# Patient Record
Sex: Male | Born: 1950 | Race: White | Hispanic: No | Marital: Married | Smoking: Never smoker
Health system: Southern US, Community
[De-identification: ages and names within clinical notes are randomized; demographics above are authoritative.]

## PROBLEM LIST (undated history)

## (undated) DIAGNOSIS — I10 Essential (primary) hypertension: Secondary | ICD-10-CM

## (undated) DIAGNOSIS — K219 Gastro-esophageal reflux disease without esophagitis: Secondary | ICD-10-CM

## (undated) DIAGNOSIS — N400 Enlarged prostate without lower urinary tract symptoms: Secondary | ICD-10-CM

## (undated) DIAGNOSIS — T8859XA Other complications of anesthesia, initial encounter: Secondary | ICD-10-CM

## (undated) HISTORY — PX: HERNIA REPAIR: SHX51

## (undated) HISTORY — PX: CLAVICLE SURGERY: SHX598

## (undated) HISTORY — PX: VASECTOMY: SHX75

---

## 2005-06-09 ENCOUNTER — Ambulatory Visit: Payer: Self-pay | Admitting: Internal Medicine

## 2005-06-24 ENCOUNTER — Ambulatory Visit: Payer: Self-pay | Admitting: Internal Medicine

## 2006-04-01 ENCOUNTER — Ambulatory Visit: Payer: Self-pay | Admitting: Internal Medicine

## 2006-04-06 ENCOUNTER — Ambulatory Visit: Payer: Self-pay | Admitting: Internal Medicine

## 2006-05-03 ENCOUNTER — Emergency Department (HOSPITAL_COMMUNITY): Admission: EM | Admit: 2006-05-03 | Discharge: 2006-05-03 | Payer: Self-pay | Admitting: Emergency Medicine

## 2006-05-19 DIAGNOSIS — G2581 Restless legs syndrome: Secondary | ICD-10-CM | POA: Insufficient documentation

## 2006-05-20 ENCOUNTER — Ambulatory Visit: Payer: Self-pay | Admitting: Internal Medicine

## 2006-07-12 ENCOUNTER — Ambulatory Visit: Payer: Self-pay | Admitting: Internal Medicine

## 2006-07-12 DIAGNOSIS — F988 Other specified behavioral and emotional disorders with onset usually occurring in childhood and adolescence: Secondary | ICD-10-CM | POA: Insufficient documentation

## 2006-07-12 DIAGNOSIS — I889 Nonspecific lymphadenitis, unspecified: Secondary | ICD-10-CM

## 2006-07-18 ENCOUNTER — Ambulatory Visit: Payer: Self-pay | Admitting: Gastroenterology

## 2006-07-29 ENCOUNTER — Ambulatory Visit: Payer: Self-pay | Admitting: Gastroenterology

## 2006-07-29 ENCOUNTER — Encounter: Payer: Self-pay | Admitting: Internal Medicine

## 2006-07-29 ENCOUNTER — Encounter: Payer: Self-pay | Admitting: Gastroenterology

## 2006-08-16 ENCOUNTER — Telehealth (INDEPENDENT_AMBULATORY_CARE_PROVIDER_SITE_OTHER): Payer: Self-pay | Admitting: *Deleted

## 2006-09-21 ENCOUNTER — Telehealth (INDEPENDENT_AMBULATORY_CARE_PROVIDER_SITE_OTHER): Payer: Self-pay | Admitting: *Deleted

## 2006-10-28 ENCOUNTER — Ambulatory Visit: Payer: Self-pay | Admitting: Internal Medicine

## 2006-10-28 DIAGNOSIS — J309 Allergic rhinitis, unspecified: Secondary | ICD-10-CM | POA: Insufficient documentation

## 2006-11-30 ENCOUNTER — Telehealth (INDEPENDENT_AMBULATORY_CARE_PROVIDER_SITE_OTHER): Payer: Self-pay | Admitting: *Deleted

## 2007-04-25 ENCOUNTER — Telehealth: Payer: Self-pay | Admitting: Internal Medicine
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
## 2011-07-29 ENCOUNTER — Encounter: Payer: Self-pay | Admitting: Gastroenterology

## 2012-04-27 ENCOUNTER — Encounter: Payer: Self-pay | Admitting: Gastroenterology

## 2013-12-27 ENCOUNTER — Encounter (HOSPITAL_BASED_OUTPATIENT_CLINIC_OR_DEPARTMENT_OTHER): Payer: Self-pay | Admitting: *Deleted

## 2013-12-27 ENCOUNTER — Emergency Department (HOSPITAL_BASED_OUTPATIENT_CLINIC_OR_DEPARTMENT_OTHER): Payer: BC Managed Care – PPO

## 2013-12-27 ENCOUNTER — Emergency Department (HOSPITAL_BASED_OUTPATIENT_CLINIC_OR_DEPARTMENT_OTHER)
Admission: EM | Admit: 2013-12-27 | Discharge: 2013-12-27 | Disposition: A | Discharge status: Home / Self Care | Payer: BC Managed Care – PPO | Attending: Emergency Medicine | Admitting: Emergency Medicine

## 2013-12-27 DIAGNOSIS — J209 Acute bronchitis, unspecified: Secondary | ICD-10-CM | POA: Diagnosis not present

## 2013-12-27 DIAGNOSIS — R05 Cough: Secondary | ICD-10-CM | POA: Diagnosis present

## 2013-12-27 DIAGNOSIS — J0110 Acute frontal sinusitis, unspecified: Secondary | ICD-10-CM | POA: Diagnosis not present

## 2013-12-27 DIAGNOSIS — Z87438 Personal history of other diseases of male genital organs: Secondary | ICD-10-CM | POA: Insufficient documentation

## 2013-12-27 DIAGNOSIS — R059 Cough, unspecified: Secondary | ICD-10-CM

## 2013-12-27 DIAGNOSIS — J40 Bronchitis, not specified as acute or chronic: Secondary | ICD-10-CM

## 2013-12-27 HISTORY — DX: Benign prostatic hyperplasia without lower urinary tract symptoms: N40.0

## 2013-12-27 MED ORDER — AMOXICILLIN-POT CLAVULANATE 875-125 MG PO TABS: 1.0000 | ORAL_TABLET | Freq: Two times a day (BID) | ORAL | Status: DC

## 2013-12-27 MED ORDER — ALBUTEROL SULFATE HFA 108 (90 BASE) MCG/ACT IN AERS: 1.0000 | INHALATION_SPRAY | Freq: Four times a day (QID) | RESPIRATORY_TRACT | Status: DC | PRN

## 2013-12-27 NOTE — ED Provider Notes (Signed)
CSN: [id number]     Arrival date & time 12/27/13  [id number] History   First MD Initiated Contact with Patient 12/27/13 [phone]     Chief Complaint  Patient presents with  . Cough     (Consider location/radiation/quality/duration/timing/severity/associated sxs/prior Treatment) The history is provided by the patient.  Darren Thornton is a 63 y.o. male history of BPH, previous sinus infections here with sinus congestion and cough. Some sinus congestion and runny nose with greenish drainage for the last week. Also having productive cough with greenish sputum. States that he has some sinus pressure and some headaches associated with it. Denies any fevers. Has been trying Tylenol and Motrin and guaifenesin with minimal relief. Denies any chest pain. Has previous sinus infection several years ago.     Past Medical History  Diagnosis Date  . Prostate enlargement    History reviewed. No pertinent past surgical history. History reviewed. No pertinent family history. History  Substance Use Topics  . Smoking status: Never Smoker   . Smokeless tobacco: Not on file  . Alcohol Use: Not on file    Review of Systems  HENT: Positive for congestion and rhinorrhea.   Respiratory: Positive for cough.   All other systems reviewed and are negative.     Allergies  Review of patient's allergies indicates no known allergies.  Home Medications   Prior to Admission medications   Not on File   BP 171/95 mmHg  Pulse 60  Temp(Src) 98 F (36.7 C) (Oral)  Resp 16  Ht 5\' 7"  (1.702 m)  Wt 155 lb (70.308 kg)  BMI 24.27 kg/m2  SpO2 100% Physical Exam  Constitutional: He is oriented to person, place, and time.  Slightly uncomfortable   HENT:  Head: Normocephalic.  Mouth/Throat: Oropharynx is clear and moist.  + sinus pressure. No obvious purulent drainage. OP clear   Eyes: Conjunctivae and EOM are normal. Pupils are equal, round, and reactive to light.  Neck: Normal range of motion. Neck supple.   Cardiovascular: Normal rate, regular rhythm and normal heart sounds.   Pulmonary/Chest:  Diminished breath sounds throughout. No obvious crackles or wheezing   Abdominal: Soft. Bowel sounds are normal. He exhibits no distension. There is no tenderness. There is no rebound and no guarding.  Musculoskeletal: Normal range of motion. He exhibits no edema or tenderness.  Neurological: He is alert and oriented to person, place, and time. No cranial nerve deficit. Coordination normal.  Skin: Skin is warm and dry.  Psychiatric: He has a normal mood and affect. His behavior is normal. Judgment and thought content normal.  Nursing note and vitals reviewed.   ED Course  Procedures (including critical care time) Labs Review Labs Reviewed - No data to display  Imaging Review Dg Chest 2 View  12/27/2013   CLINICAL DATA:  Cough and chest congestion.  EXAM: CHEST  2 VIEW  COMPARISON:  05/03/2006  FINDINGS: There is peribronchial thickening. There are no infiltrates or effusions. Tiny areas of linear atelectasis at the lung bases. Heart size and vascularity are normal. No significant osseous abnormality. Old left clavicle fracture. Chronic AC joint separation on the right.  IMPRESSION: Bronchitic changes.   Electronically Signed   By: Geanie CooleyJim  Maxwell M.D.   On: 12/27/2013 07:59     EKG Interpretation None      MDM   Final diagnoses:  Cough    Darren Thornton is a 63 y.o. male here with cough, sinus congestion. Likely sinusitis vs bronchitis vs viral syndrome. Will  get CXR. Will likely treat with abx.   8:06 AM CXR showed bronchitis. Will d/c home with augmentin, albuterol prn.     Richardean Canalavid H Yao, MD 12/27/13 [phone]

## 2013-12-27 NOTE — Discharge Instructions (Signed)
Take augmentin twice a day for a week.   Stay hydrated.   Continue tylenol, motrin.   Use albuterol as needed for cough.   Follow up with your doctor   Return to ER if you have worse cough, fever, trouble breathing.

## 2013-12-27 NOTE — ED Notes (Signed)
Pt amb to room 5 with quick steady gait in nad. Pt reports cough producing green and yellow phlegm x 1 week, with sinus drainage. Denies any fevers.

## 2017-08-06 ENCOUNTER — Emergency Department (HOSPITAL_BASED_OUTPATIENT_CLINIC_OR_DEPARTMENT_OTHER)
Admission: EM | Admit: 2017-08-06 | Discharge: 2017-08-06 | Disposition: A | Discharge status: Home / Self Care | Payer: Medicare Other | Attending: Emergency Medicine | Admitting: Emergency Medicine

## 2017-08-06 ENCOUNTER — Encounter (HOSPITAL_BASED_OUTPATIENT_CLINIC_OR_DEPARTMENT_OTHER): Payer: Self-pay | Admitting: *Deleted

## 2017-08-06 ENCOUNTER — Other Ambulatory Visit: Payer: Self-pay

## 2017-08-06 DIAGNOSIS — Y999 Unspecified external cause status: Secondary | ICD-10-CM | POA: Insufficient documentation

## 2017-08-06 DIAGNOSIS — S80811A Abrasion, right lower leg, initial encounter: Secondary | ICD-10-CM | POA: Diagnosis not present

## 2017-08-06 DIAGNOSIS — T07XXXA Unspecified multiple injuries, initial encounter: Secondary | ICD-10-CM

## 2017-08-06 DIAGNOSIS — I10 Essential (primary) hypertension: Secondary | ICD-10-CM | POA: Diagnosis not present

## 2017-08-06 DIAGNOSIS — Z23 Encounter for immunization: Secondary | ICD-10-CM | POA: Insufficient documentation

## 2017-08-06 DIAGNOSIS — Y929 Unspecified place or not applicable: Secondary | ICD-10-CM | POA: Insufficient documentation

## 2017-08-06 DIAGNOSIS — S51011A Laceration without foreign body of right elbow, initial encounter: Secondary | ICD-10-CM | POA: Insufficient documentation

## 2017-08-06 DIAGNOSIS — Y939 Activity, unspecified: Secondary | ICD-10-CM | POA: Diagnosis not present

## 2017-08-06 DIAGNOSIS — S59901A Unspecified injury of right elbow, initial encounter: Secondary | ICD-10-CM | POA: Diagnosis present

## 2017-08-06 HISTORY — DX: Essential (primary) hypertension: I10

## 2017-08-06 HISTORY — DX: Gastro-esophageal reflux disease without esophagitis: K21.9

## 2017-08-06 MED ORDER — BACITRACIN ZINC 500 UNIT/GM EX OINT
TOPICAL_OINTMENT | Freq: Two times a day (BID) | CUTANEOUS | Status: DC
  Administered 2017-08-06: 16:00:00 via TOPICAL

## 2017-08-06 MED ORDER — TETANUS-DIPHTH-ACELL PERTUSSIS 5-2.5-18.5 LF-MCG/0.5 IM SUSP
0.5000 mL | Freq: Once | INTRAMUSCULAR | Status: AC
  Administered 2017-08-06: 0.5 mL via INTRAMUSCULAR
  Filled 2017-08-06: qty 0.5

## 2017-08-06 MED ORDER — LIDOCAINE-EPINEPHRINE (PF) 2 %-1:200000 IJ SOLN
10.0000 mL | Freq: Once | INTRAMUSCULAR | Status: AC
  Administered 2017-08-06: 10 mL
  Filled 2017-08-06 (×2): qty 10

## 2017-08-06 MED ORDER — CEPHALEXIN 500 MG PO CAPS: 500.0000 mg | ORAL_CAPSULE | Freq: Four times a day (QID) | ORAL | 0 refills | Status: DC

## 2017-08-06 NOTE — Discharge Instructions (Signed)
Please read and follow all provided instructions.  Your diagnoses today include:  1. Laceration of right elbow, initial encounter   2. Abrasions of multiple sites     Tests performed today include:  Vital signs. See below for your results today.   Medications prescribed:   Keflex (cephalexin) - antibiotic  You have been prescribed an antibiotic medicine: take the entire course of medicine even if you are feeling better. Stopping early can cause the antibiotic not to work.  Take any prescribed medications only as directed.   Home care instructions:  Follow any educational materials and wound care instructions contained in this packet.   Keep affected area above the level of your heart when possible to minimize swelling. Wash area gently twice a day with warm soapy water. Do not apply alcohol or hydrogen peroxide. Cover the area if it draining or weeping.   Follow-up instructions: Suture Removal: Return to the Emergency Department or see your primary care care doctor in 10 days for a recheck of your wound and removal of your sutures or staples.    Return instructions:  Return to the Emergency Department if you have:  Fever  Worsening pain  Worsening swelling of the wound  Pus draining from the wound  Redness of the skin that moves away from the wound, especially if it streaks away from the affected area   Any other emergent concerns  Your vital signs today were: BP (!) 147/95 (BP Location: Left Arm)    Pulse 71    Temp 98 F (36.7 C) (Oral)    Resp 19    Ht 5\' 7"  (1.702 m)    Wt 72.6 kg (160 lb)    SpO2 99%    BMI 25.06 kg/m  If your blood pressure (BP) was elevated above 135/85 this visit, please have this repeated by your doctor within one month. --------------
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
## 2017-08-07 ENCOUNTER — Encounter (HOSPITAL_BASED_OUTPATIENT_CLINIC_OR_DEPARTMENT_OTHER): Payer: Self-pay

## 2019-02-13 ENCOUNTER — Ambulatory Visit: Payer: BLUE CROSS/BLUE SHIELD

## 2019-02-24 ENCOUNTER — Ambulatory Visit: Payer: Medicare Other | Attending: Internal Medicine

## 2019-03-06 ENCOUNTER — Ambulatory Visit: Payer: BLUE CROSS/BLUE SHIELD

## 2019-12-11 ENCOUNTER — Emergency Department (HOSPITAL_BASED_OUTPATIENT_CLINIC_OR_DEPARTMENT_OTHER)
Admission: EM | Admit: 2019-12-11 | Discharge: 2019-12-11 | Disposition: A | Discharge status: Home / Self Care | Payer: Medicare Other | Attending: Emergency Medicine | Admitting: Emergency Medicine

## 2019-12-11 ENCOUNTER — Encounter (HOSPITAL_BASED_OUTPATIENT_CLINIC_OR_DEPARTMENT_OTHER): Payer: Self-pay

## 2019-12-11 ENCOUNTER — Other Ambulatory Visit: Payer: Self-pay

## 2019-12-11 DIAGNOSIS — R339 Retention of urine, unspecified: Secondary | ICD-10-CM

## 2019-12-11 DIAGNOSIS — I10 Essential (primary) hypertension: Secondary | ICD-10-CM | POA: Insufficient documentation

## 2019-12-11 LAB — URINALYSIS, ROUTINE W REFLEX MICROSCOPIC
Bilirubin Urine: NEGATIVE
Glucose, UA: NEGATIVE mg/dL
Ketones, ur: NEGATIVE mg/dL
Leukocytes,Ua: NEGATIVE
Nitrite: NEGATIVE
Protein, ur: NEGATIVE mg/dL
Specific Gravity, Urine: 1.01 (ref 1.005–1.030)
pH: 5.5 (ref 5.0–8.0)

## 2019-12-11 LAB — URINALYSIS, MICROSCOPIC (REFLEX)

## 2019-12-11 NOTE — ED Notes (Signed)
ED Provider at bedside. 

## 2019-12-11 NOTE — ED Triage Notes (Signed)
Pt arrives with pain and trouble urinating denies history of same. Pt reports he thinks he has a kidney stone.

## 2019-12-11 NOTE — ED Provider Notes (Signed)
[facility] EMERGENCY DEPARTMENT Provider Note   CSN: [id number] Arrival date & time: 12/11/19  1034     History Chief Complaint  Patient presents with  . Urinary Retention    Mouhamadou Gittleman is a 69 y.o. male w/ past medical history of BPH that presents to the emergency department today for urinary retention.  Patient has had urinary retention for the past couple of hours, went to [facility] who sent him here for further evaluation.  Patient follows Dr. Leonette Most, urology, [facility].  He states that he has not seen him for over 2 to 3 years, also states that he stopped taking his BPH medications 3 years ago.  Patient states that he was able to wake up this morning and urinate some, states that he was unable to fully relieve his bladder.  Patient states that he drank about 3 cups of coffee, and was not able to urinate.  States that he is having super abdominal pain.  Denies any nausea, vomiting, fevers, chills, recent back trauma, saddle paresthesias, new back pain.  Patient states that he was able to have a normal bowel movement this morning.  States he was in normal health yesterday.  States this is never happened to him before.  Denies any dysuria when he did urinate this morning.  States that he does have hypertension, however was told that he did not need to take blood pressure medication for this.  Denies pain elsewhere.  HPI     Past Medical History:  Diagnosis Date  . Acid reflux   . Hypertension    borderline  . Prostate enlargement     Patient Active Problem List   Diagnosis Date Noted  . RHINITIS 10/28/2006  . 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 ADD 07/12/2006  . RESTLESS LEG SYNDROME 05/19/2006    Past Surgical History:  Procedure Laterality Date  . CLAVICLE SURGERY    . HERNIA REPAIR    . VASECTOMY         History reviewed. No pertinent family history.  Social History   Tobacco Use  . Smoking status: Never Smoker  . Smokeless tobacco:  Never Used  Substance Use Topics  . Alcohol use: Yes  . Drug use: Never    Home Medications Prior to Admission medications   Medication Sig Start Date End Date Taking? Authorizing Provider  cephALEXin (KEFLEX) 500 MG capsule Take 1 capsule (500 mg total) by mouth 4 (four) times daily. 08/06/17   Renne Crigler, PA-C    Allergies    Patient has no known allergies.  Review of Systems   Review of Systems  Constitutional: Negative for chills, diaphoresis, fatigue and fever.  HENT: Negative for congestion, sore throat and trouble swallowing.   Eyes: Negative for pain and visual disturbance.  Respiratory: Negative for cough, shortness of breath and wheezing.   Cardiovascular: Negative for chest pain, palpitations and leg swelling.  Gastrointestinal: Positive for abdominal pain. Negative for abdominal distention, diarrhea, nausea and vomiting.  Genitourinary: Positive for decreased urine volume. Negative for difficulty urinating, discharge, enuresis, flank pain, frequency, hematuria, penile pain, penile swelling, scrotal swelling, testicular pain and urgency.  Musculoskeletal: Negative for back pain, neck pain and neck stiffness.  Skin: Negative for pallor.  Neurological: Negative for dizziness, speech difficulty, weakness and headaches.  Psychiatric/Behavioral: Negative for confusion.    Physical Exam Updated Vital Signs BP (!) 214/113 (BP Location: Right Arm)   Pulse 64   Temp 97.6 F (36.4 C) (Oral)  Resp 20   Ht 5\' 7"  (1.702 m)   Wt 74.8 kg   SpO2 99%   BMI 25.84 kg/m   Physical Exam Constitutional:      General: He is not in acute distress.    Appearance: Normal appearance. He is not ill-appearing, toxic-appearing or diaphoretic.  HENT:     Mouth/Throat:     Mouth: Mucous membranes are moist.     Pharynx: Oropharynx is clear.  Eyes:     General: No scleral icterus.    Extraocular Movements: Extraocular movements intact.     Pupils: Pupils are equal, round, and  reactive to light.  Cardiovascular:     Rate and Rhythm: Normal rate and regular rhythm.     Pulses: Normal pulses.     Heart sounds: Normal heart sounds.  Pulmonary:     Effort: Pulmonary effort is normal. No respiratory distress.     Breath sounds: Normal breath sounds. No stridor. No wheezing, rhonchi or rales.  Chest:     Chest wall: No tenderness.  Abdominal:     General: Abdomen is flat. There is no distension.     Palpations: Abdomen is soft.     Tenderness: There is abdominal tenderness in the suprapubic area. There is no guarding or rebound.  Musculoskeletal:        General: No swelling or tenderness. Normal range of motion.     Cervical back: Normal range of motion and neck supple. No rigidity.     Right lower leg: No edema.     Left lower leg: No edema.  Skin:    General: Skin is warm and dry.     Capillary Refill: Capillary refill takes less than 2 seconds.     Coloration: Skin is not pale.  Neurological:     General: No focal deficit present.     Mental Status: He is alert and oriented to person, place, and time.  Psychiatric:        Mood and Affect: Mood normal.        Behavior: Behavior normal.     ED Results / Procedures / Treatments   Labs (all labs ordered are listed, but only abnormal results are displayed) Labs Reviewed  URINALYSIS, ROUTINE W REFLEX MICROSCOPIC    EKG None  Radiology No results found.  Procedures Procedures (including critical care time)  Medications Ordered in ED Medications - No data to display  ED Course  I have reviewed the triage vital signs and the nursing notes.  Pertinent labs & imaging results that were available during my care of the patient were reviewed by me and considered in my medical decision making (see chart for details).    MDM Rules/Calculators/A&P                         Clive Parcel is a 69 y.o. male w/ past medical history of BPH that presents to the emergency department today for urinary  retention.  Patient with 906 mL of urine in bladder, most likely from known BPH.  No red flag symptoms.  No blood work is necessary at this time, patient will follow up with urologist.  Indwelling catheter placed, patient will remove this in the next 2 days.  Urinalysis not suggestive of UTI, small Hgb.   1 L of urine drained, pt feels much better, states that he is pain free. BP has resolved  - now 156/99 which pt states is normal for him.  Patient will follow up with urologist in the next 2 days, discussed in depth with patient and wife who are agreeable.  Doubt need for further emergent work up at this time. I explained the diagnosis and have given explicit precautions to return to the ER including for any other new or worsening symptoms. The patient understands and accepts the medical plan as it's been dictated and I have answered their questions. Discharge instructions concerning home care and prescriptions have been given. The patient is STABLE and is discharged to home in good condition.  I discussed this case with my attending physician who cosigned this note including patient's presenting symptoms, physical exam, and planned diagnostics and interventions. Attending physician stated agreement with plan or made changes to plan which were implemented.   Attending physician assessed patient at bedside.  Final Clinical Impression(s) / ED Diagnoses Final diagnoses:  Urinary retention    Rx / DC Orders ED Discharge Orders    None       Farrel Gordon, PA-C 12/11/19 1135    Cathren Laine, MD 12/11/19 1140

## 2019-12-11 NOTE — ED Notes (Signed)
Pt transitioned to leg bag by [facility] ED Tech and given education for home care. Total urine output was 1100 mL.

## 2019-12-11 NOTE — Discharge Instructions (Addendum)
Follow up in two days to have your catheter removed. You can either follow up with Dr. Leonette Most or make an appointment with the urologist provided. You can also go to an urgent care or come back here to get this removed. Please read the following instructions.  Please go to the emergency department for any new or concerning symptoms.

## 2020-01-03 ENCOUNTER — Encounter: Payer: Self-pay | Admitting: Orthopedic Surgery

## 2020-01-03 ENCOUNTER — Ambulatory Visit: Payer: Medicare Other | Admitting: Orthopedic Surgery

## 2020-01-03 ENCOUNTER — Ambulatory Visit: Payer: Self-pay

## 2020-01-03 ENCOUNTER — Other Ambulatory Visit: Payer: Self-pay

## 2020-01-03 DIAGNOSIS — M79672 Pain in left foot: Secondary | ICD-10-CM

## 2020-01-03 DIAGNOSIS — M216X2 Other acquired deformities of left foot: Secondary | ICD-10-CM

## 2020-01-03 NOTE — Progress Notes (Signed)
Office Visit Note   Patient: Darren Thornton           Date of Birth: 31-Dec-1950           MRN: [id number] Visit Date: 01/03/2020              Requested by: Loyal Jacobson, MD [address] [place],  [place] [address] PCP: Loyal Jacobson, MD  Chief Complaint  Patient presents with  . 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  Patient states that he has had some chronic pain of the base of the fifth metatarsal on the left foot.  He states that he had increased pain over the past 3 to 4 weeks he states he has no symptoms with all on a bicycle trainer denies any specific injury.  Patient has no history of diabetes hypertension or smoking.  Assessment & Plan: Visit Diagnoses:  1. Pain in left foot   2. Cavovarus deformity of foot, acquired, left     Plan: Patient has a chronic overloading of the peroneus brevis due to his cavovarus deformity and plantarflexed first ray.  Recommended a stiff soled Trail running sneaker recommended sole orthotics and cut out beneath the first metatarsal head to allow for plantarflexion of the first ray and this was drawn on his orthotic.  This should get his foot out and stressing the lateral column and should resolve his symptoms.  Follow-Up Instructions: Return if symptoms worsen or fail to improve.   Ortho Exam  Patient is alert, oriented, no adenopathy, well-dressed, normal affect, normal respiratory effort. Examination patient has good pulses he has a plantarflexed first ray which places his hindfoot in varus.  He has tenderness to palpation of the base of the fifth metatarsal.  He has good subtalar and ankle range of motion he has good pulses.  Due to the patient's plantarflexed first ray and cavovarus foot he is overstressing the peroneus brevis with varus hindfoot.  The peroneus brevis is nontender to palpation.  Imaging: XR Foot Complete Left  Result Date:  01/03/2020 Three-view radiographs of the left foot shows an os perinei there is also an old chronic avulsion fracture of the base of the fifth metatarsal but no acute fractures.  The medial column is well aligned.  Patient has a plantarflexed first ray.  No images are attached to the encounter.  Labs: No results found for: HGBA1C, ESRSEDRATE, CRP, LABURIC, REPTSTATUS, GRAMSTAIN, CULT, LABORGA   No results found for: ALBUMIN, PREALBUMIN, LABURIC  No results found for: MG No results found for: VD25OH  No results found for: PREALBUMIN No flowsheet data found.   There is no height or weight on file to calculate BMI.  Orders:  Orders Placed This Encounter  Procedures  . XR Foot Complete Left   No orders of the defined types were placed in this encounter.    Procedures: No procedures performed  Clinical Data: No additional findings.  ROS:  All other systems negative, except as noted in the HPI. Review of Systems  Objective: Vital Signs: There were no vitals taken for this visit.  Specialty Comments:  No specialty comments available.  PMFS History: Patient Active Problem List   Diagnosis Date Noted  . RHINITIS 10/28/2006  . 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 ADD 07/12/2006  . RESTLESS LEG SYNDROME 05/19/2006   Past Medical History:  Diagnosis Date  . Acid reflux   . Hypertension  borderline  . Prostate enlargement     History reviewed. No pertinent family history.  Past Surgical History:  Procedure Laterality Date  . CLAVICLE SURGERY    . HERNIA REPAIR    . VASECTOMY     Social History   Occupational History  . Not on file  Tobacco Use  . Smoking status: Never Smoker  . Smokeless tobacco: Never Used  Substance and Sexual Activity  . Alcohol use: Yes  . Drug use: Never  . Sexual activity: Not on file

## 2020-01-29 ENCOUNTER — Telehealth: Payer: Self-pay

## 2020-01-29 NOTE — Telephone Encounter (Signed)
Patient called he stated he would like a call back regarding his instructions from surgery.  [phone]

## 2020-01-30 NOTE — Telephone Encounter (Signed)
I called pt and he wants to know if he can run. I advised he could try and increase his activity provided that he is doing the conservative things that Dr. Lajoyce Corners has suggested ( stiff sole shoe and orthotics etc) pt wants to know if this is something that will last a long time. I advised that with following the suggests discussed at office visit trying to see if putting his foot in best possible position/ situation will alleviate the symptoms and that he will not be in pain. Pt will call with any questions or concerns.

## 2020-09-16 ENCOUNTER — Emergency Department (HOSPITAL_BASED_OUTPATIENT_CLINIC_OR_DEPARTMENT_OTHER)
Admission: EM | Admit: 2020-09-16 | Discharge: 2020-09-16 | Disposition: A | Discharge status: Home / Self Care | Payer: Medicare Other | Attending: Emergency Medicine | Admitting: Emergency Medicine

## 2020-09-16 ENCOUNTER — Other Ambulatory Visit: Payer: Self-pay

## 2020-09-16 ENCOUNTER — Encounter (HOSPITAL_BASED_OUTPATIENT_CLINIC_OR_DEPARTMENT_OTHER): Payer: Self-pay | Admitting: Emergency Medicine

## 2020-09-16 DIAGNOSIS — R339 Retention of urine, unspecified: Secondary | ICD-10-CM | POA: Diagnosis present

## 2020-09-16 DIAGNOSIS — I10 Essential (primary) hypertension: Secondary | ICD-10-CM | POA: Insufficient documentation

## 2020-09-16 LAB — URINALYSIS, MICROSCOPIC (REFLEX)

## 2020-09-16 LAB — URINALYSIS, ROUTINE W REFLEX MICROSCOPIC
Bilirubin Urine: NEGATIVE
Glucose, UA: NEGATIVE mg/dL
Ketones, ur: NEGATIVE mg/dL
Leukocytes,Ua: NEGATIVE
Nitrite: NEGATIVE
Protein, ur: NEGATIVE mg/dL
Specific Gravity, Urine: 1.01 (ref 1.005–1.030)
pH: 5 (ref 5.0–8.0)

## 2020-09-16 NOTE — Discharge Instructions (Addendum)
Please follow-up with the urologist this morning at your appointment, they may take the catheter out if they see fit  Emergency department for severe or worsening symptoms

## 2020-09-16 NOTE — ED Notes (Signed)
Large foley urine drainage bag changed for leg bag, per pts request. No further needs expressed.

## 2020-09-16 NOTE — ED Provider Notes (Signed)
[facility] EMERGENCY DEPARTMENT Provider Note   CSN: [id number] Arrival date & time: 09/16/20  0406     History Chief Complaint  Patient presents with   Urinary Retention    Darren Thornton is a 70 y.o. male.  This patient is a very pleasant 70 year old male, he has a history of prostate enlargement and intermittent urinary retention, presents with a complaint of not being able to urinate overnight, he states that he has had to have a catheter twice in the past once in November and then when he was visiting [place] recently at for his daughter's wedding he needed to be catheterized at that time as well.  Usually the patient does not have a catheter, he had to have catheterization when he arrived.  He was sweaty, feeling uncomfortable in the stomach.  He denies any burning with urination fevers chills nausea or vomiting.       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 No pertinent family history.  Social History   Tobacco Use   Smoking status: Never   Smokeless tobacco: Never  Vaping Use   Vaping Use: Never used  Substance Use Topics   Alcohol use: Yes   Drug use: Yes    Types: Marijuana    Home Medications Prior to Admission medications   Medication Sig Start Date End Date Taking? Authorizing Provider  cephALEXin (KEFLEX) 500 MG capsule Take 1 capsule (500 mg total) by mouth 4 (four) times daily. 08/06/17   Renne Crigler, PA-C    Allergies    Patient has no known allergies.  Review of Systems   Review of Systems  Constitutional:  Negative for fever.  Genitourinary:  Positive for  difficulty urinating.   Physical Exam Updated Vital Signs BP (!) 142/80 (BP Location: Right Arm)   Pulse (!) 54   Temp 97.8 F (36.6 C) (Oral)   Resp 16   Ht 1.727 m (5\' 8" )   Wt 72.6 kg   SpO2 98%   BMI 24.33 kg/m   Physical Exam Vitals and nursing note reviewed.  Constitutional:      Appearance: He is well-developed. He is not diaphoretic.  HENT:     Head: Normocephalic and atraumatic.  Eyes:     General:        Right eye: No discharge.        Left eye: No discharge.     Conjunctiva/sclera: Conjunctivae normal.  Pulmonary:     Effort: Pulmonary effort is normal. No respiratory distress.  Abdominal:     Comments: Completely nontender and soft abdomen  Genitourinary:    Comments: Normal-appearing genitals, Foley catheter in place Skin:    General: Skin is warm and dry.     Findings: No erythema or rash.  Neurological:     Mental Status: He is alert.     Coordination: Coordination normal.    ED Results / Procedures / Treatments   Labs (all labs ordered are listed, but only abnormal results are displayed) Labs Reviewed  URINALYSIS, ROUTINE  W REFLEX MICROSCOPIC - Abnormal; Notable for the following components:      Result Value   APPearance HAZY (*)    Hgb urine dipstick SMALL (*)    All other components within normal limits  URINALYSIS, MICROSCOPIC (REFLEX) - Abnormal; Notable for the following components:   Bacteria, UA RARE (*)    All other components within normal limits  URINE CULTURE    EKG None  Radiology No results found.  Procedures Procedures   Medications Ordered in ED Medications - No data to display  ED Course  I have reviewed the triage vital signs and the nursing notes.  Pertinent labs & imaging results that were available during my care of the patient were reviewed by me and considered in my medical decision making (see chart for details).    MDM Rules/Calculators/A&P                           Urinalysis negative for infection,  likely related to prostate, already on Flomax, has appointment with urologist in 1 hour, will discharge with Foley catheter in place  Final Clinical Impression(s) / ED Diagnoses Final diagnoses:  Urinary retention    Rx / DC Orders ED Discharge Orders     None        Eber Hong, MD 09/16/20 [phone]

## 2020-09-16 NOTE — ED Triage Notes (Signed)
Pt states he is unable to void  Has hx of same

## 2020-09-17 LAB — URINE CULTURE: Culture: 20000 — AB

## 2020-09-18 ENCOUNTER — Telehealth: Payer: Self-pay | Admitting: *Deleted

## 2020-09-18 NOTE — Telephone Encounter (Signed)
Post ED Visit - Positive Culture Follow-up  Culture report reviewed by antimicrobial stewardship pharmacist: [facility] []  , Pharm.D. []  Enzo Bi, Pharm.D., BCPS AQ-ID []  , Pharm.D., BCPS []  Celedonio Miyamoto, Pharm.D., BCPS []  Dougherty, Garvin Fila.D., BCPS, AAHIVP []  , Pharm.D., BCPS, AAHIVP []  Georgina Pillion, PharmD, BCPS []  , PharmD, BCPS []  [place], PharmD, BCPS []  [place], PharmD []  , PharmD, BCPS []  Estella Husk, PharmD  Pharmacy Team []  Lysle Pearl, PharmD []  , PharmD []  Phillips Climes, PharmD []  , Rph []  Agapito Games) , PharmD []  Verlan Friends, PharmD []  , PharmD []  Mervyn Gay, PharmD []  , PharmD []  Vinnie Level, PharmD []  Wonda [facility], PharmD []  , PharmD []  Len Childs, PharmD   Positive urine culture No action needed and no further patient follow-up is required at this time.  [address] Pharm D  Greer Pickerel 09/18/2020, 12:38 PM

## 2020-10-10 ENCOUNTER — Other Ambulatory Visit: Payer: Self-pay | Admitting: Urology

## 2020-10-15 ENCOUNTER — Encounter (HOSPITAL_BASED_OUTPATIENT_CLINIC_OR_DEPARTMENT_OTHER): Payer: Self-pay | Admitting: Urology

## 2020-10-15 ENCOUNTER — Other Ambulatory Visit: Payer: Self-pay

## 2020-10-15 NOTE — Progress Notes (Signed)
Spoke w/ via phone for pre-op interview---pt  Lab needs dos---- I STAT, EKG              Lab results------Covid COVID test -----patient states asymptomatic no test needed Arrive at -------0915 10/22/2020 NPO after MN NO Solid Food.  Clear liquids from MN until---0430 10/22/2020 Med rec completed Medications to take morning of surgery -----none Diabetic medication -----none Patient instructed no nail polish to be worn day of surgery Patient instructed to bring photo id and insurance card day of surgery Patient aware to have Driver (ride ) / caregiver Dondra Spry his wife   for 24 hours after surgery  Patient Special Instructions -----none Pre-Op special Istructions -----none Patient verbalized understanding of instructions that were given at this phone interview. Patient denies shortness of breath, chest pain, fever, cough at this phone interview.

## 2020-10-20 ENCOUNTER — Other Ambulatory Visit: Payer: Self-pay | Admitting: Urology

## 2020-10-20 LAB — SARS CORONAVIRUS 2 (TAT 6-24 HRS): SARS Coronavirus 2: NEGATIVE

## 2020-10-22 ENCOUNTER — Encounter (HOSPITAL_BASED_OUTPATIENT_CLINIC_OR_DEPARTMENT_OTHER): Payer: Self-pay | Admitting: Urology

## 2020-10-22 ENCOUNTER — Ambulatory Visit (HOSPITAL_BASED_OUTPATIENT_CLINIC_OR_DEPARTMENT_OTHER): Payer: Medicare Other | Admitting: Anesthesiology

## 2020-10-22 ENCOUNTER — Observation Stay (HOSPITAL_BASED_OUTPATIENT_CLINIC_OR_DEPARTMENT_OTHER)
Admission: RE | Admit: 2020-10-22 | Discharge: 2020-10-23 | Disposition: A | Discharge status: Home / Self Care | Payer: Medicare Other | Attending: Urology | Admitting: Urology

## 2020-10-22 ENCOUNTER — Encounter (HOSPITAL_BASED_OUTPATIENT_CLINIC_OR_DEPARTMENT_OTHER)
Admission: RE | Disposition: A | Discharge status: Home / Self Care | Payer: Self-pay | Source: Home / Self Care | Attending: Urology

## 2020-10-22 ENCOUNTER — Other Ambulatory Visit: Payer: Self-pay

## 2020-10-22 DIAGNOSIS — N401 Enlarged prostate with lower urinary tract symptoms: Secondary | ICD-10-CM | POA: Diagnosis present

## 2020-10-22 DIAGNOSIS — R338 Other retention of urine: Secondary | ICD-10-CM | POA: Diagnosis not present

## 2020-10-22 DIAGNOSIS — N320 Bladder-neck obstruction: Secondary | ICD-10-CM | POA: Diagnosis not present

## 2020-10-22 DIAGNOSIS — Z79899 Other long term (current) drug therapy: Secondary | ICD-10-CM | POA: Insufficient documentation

## 2020-10-22 DIAGNOSIS — N138 Other obstructive and reflux uropathy: Secondary | ICD-10-CM | POA: Diagnosis present

## 2020-10-22 HISTORY — DX: Other complications of anesthesia, initial encounter: T88.59XA

## 2020-10-22 HISTORY — PX: TRANSURETHRAL RESECTION OF PROSTATE: SHX73

## 2020-10-22 LAB — POCT I-STAT, CHEM 8
BUN: 16 mg/dL (ref 8–23)
Calcium, Ion: 1.25 mmol/L (ref 1.15–1.40)
Chloride: 103 mmol/L (ref 98–111)
Creatinine, Ser: 1.1 mg/dL (ref 0.61–1.24)
Glucose, Bld: 90 mg/dL (ref 70–99)
HCT: 49 % (ref 39.0–52.0)
Hemoglobin: 16.7 g/dL (ref 13.0–17.0)
Potassium: 4 mmol/L (ref 3.5–5.1)
Sodium: 143 mmol/L (ref 135–145)
TCO2: 32 mmol/L (ref 22–32)

## 2020-10-22 SURGERY — TURP (TRANSURETHRAL RESECTION OF PROSTATE)
Anesthesia: General | Site: Prostate

## 2020-10-22 MED ORDER — ONDANSETRON HCL 4 MG/2ML IJ SOLN
INTRAMUSCULAR | Status: AC
  Filled 2020-10-22: qty 2

## 2020-10-22 MED ORDER — CEFAZOLIN SODIUM-DEXTROSE 1-4 GM/50ML-% IV SOLN
1.0000 g | Freq: Three times a day (TID) | INTRAVENOUS | Status: DC
  Administered 2020-10-22 – 2020-10-23 (×2): 1 g via INTRAVENOUS

## 2020-10-22 MED ORDER — SODIUM CHLORIDE 0.9 % IR SOLN
Status: DC | PRN
  Administered 2020-10-22 (×2): 6000 mL
  Administered 2020-10-22: 18000 mL via INTRAVESICAL
  Administered 2020-10-22 (×2): 6000 mL

## 2020-10-22 MED ORDER — FENTANYL CITRATE (PF) 100 MCG/2ML IJ SOLN
INTRAMUSCULAR | Status: AC
  Filled 2020-10-22: qty 2

## 2020-10-22 MED ORDER — 0.9 % SODIUM CHLORIDE (POUR BTL) OPTIME
TOPICAL | Status: DC | PRN
  Administered 2020-10-22: 1000 mL
  Administered 2020-10-22: 500 mL

## 2020-10-22 MED ORDER — ONDANSETRON HCL 4 MG/2ML IJ SOLN
INTRAMUSCULAR | Status: DC | PRN
  Administered 2020-10-22: 4 mg via INTRAVENOUS

## 2020-10-22 MED ORDER — MENTHOL 3 MG MT LOZG
1.0000 | LOZENGE | OROMUCOSAL | Status: DC | PRN
  Administered 2020-10-22: 3 mg via ORAL

## 2020-10-22 MED ORDER — MIDAZOLAM HCL 2 MG/2ML IJ SOLN
INTRAMUSCULAR | Status: AC
  Filled 2020-10-22: qty 2

## 2020-10-22 MED ORDER — CEFAZOLIN SODIUM-DEXTROSE 1-4 GM/50ML-% IV SOLN
INTRAVENOUS | Status: AC
  Filled 2020-10-22: qty 50

## 2020-10-22 MED ORDER — EPHEDRINE 5 MG/ML INJ
INTRAVENOUS | Status: AC
  Filled 2020-10-22: qty 5

## 2020-10-22 MED ORDER — BELLADONNA ALKALOIDS-OPIUM 16.2-60 MG RE SUPP
RECTAL | Status: DC | PRN
  Administered 2020-10-22: 1 via RECTAL

## 2020-10-22 MED ORDER — FENTANYL CITRATE (PF) 100 MCG/2ML IJ SOLN
INTRAMUSCULAR | Status: DC | PRN
  Administered 2020-10-22 (×2): 50 ug via INTRAVENOUS
  Administered 2020-10-22 (×2): 25 ug via INTRAVENOUS

## 2020-10-22 MED ORDER — DEXAMETHASONE SODIUM PHOSPHATE 10 MG/ML IJ SOLN
INTRAMUSCULAR | Status: AC
  Filled 2020-10-22: qty 1

## 2020-10-22 MED ORDER — MORPHINE SULFATE (PF) 4 MG/ML IV SOLN: 2.0000 mg | INTRAVENOUS | Status: DC | PRN

## 2020-10-22 MED ORDER — OXYBUTYNIN CHLORIDE 5 MG PO TABS
5.0000 mg | ORAL_TABLET | Freq: Three times a day (TID) | ORAL | Status: DC | PRN
  Administered 2020-10-22 (×2): 5 mg via ORAL

## 2020-10-22 MED ORDER — HYDROCODONE-ACETAMINOPHEN 5-325 MG PO TABS
ORAL_TABLET | ORAL | Status: AC
  Filled 2020-10-22: qty 1

## 2020-10-22 MED ORDER — PROPOFOL 10 MG/ML IV BOLUS
INTRAVENOUS | Status: AC
  Filled 2020-10-22: qty 20

## 2020-10-22 MED ORDER — LIDOCAINE HCL (CARDIAC) PF 100 MG/5ML IV SOSY
PREFILLED_SYRINGE | INTRAVENOUS | Status: DC | PRN
  Administered 2020-10-22: 80 mg via INTRAVENOUS

## 2020-10-22 MED ORDER — CEFAZOLIN SODIUM-DEXTROSE 2-4 GM/100ML-% IV SOLN
INTRAVENOUS | Status: AC
  Filled 2020-10-22: qty 100

## 2020-10-22 MED ORDER — ONDANSETRON HCL 4 MG/2ML IJ SOLN: 4.0000 mg | INTRAMUSCULAR | Status: DC | PRN

## 2020-10-22 MED ORDER — ACETAMINOPHEN 325 MG PO TABS: 650.0000 mg | ORAL_TABLET | ORAL | Status: DC | PRN

## 2020-10-22 MED ORDER — FENTANYL CITRATE (PF) 100 MCG/2ML IJ SOLN
25.0000 ug | INTRAMUSCULAR | Status: DC | PRN
  Administered 2020-10-22 (×2): 25 ug via INTRAVENOUS

## 2020-10-22 MED ORDER — GLYCOPYRROLATE PF 0.2 MG/ML IJ SOSY
PREFILLED_SYRINGE | INTRAMUSCULAR | Status: AC
  Filled 2020-10-22: qty 1

## 2020-10-22 MED ORDER — CEFAZOLIN SODIUM-DEXTROSE 2-4 GM/100ML-% IV SOLN
2.0000 g | Freq: Once | INTRAVENOUS | Status: AC
  Administered 2020-10-22: 2 g via INTRAVENOUS

## 2020-10-22 MED ORDER — HYDROCODONE-ACETAMINOPHEN 5-325 MG PO TABS
ORAL_TABLET | ORAL | Status: AC
  Filled 2020-10-22: qty 2

## 2020-10-22 MED ORDER — BELLADONNA ALKALOIDS-OPIUM 16.2-60 MG RE SUPP
1.0000 | Freq: Four times a day (QID) | RECTAL | Status: DC | PRN
Start: 2020-10-22 — End: 2020-10-23

## 2020-10-22 MED ORDER — PROPOFOL 10 MG/ML IV BOLUS
INTRAVENOUS | Status: DC | PRN
  Administered 2020-10-22: 200 mg via INTRAVENOUS

## 2020-10-22 MED ORDER — GLYCOPYRROLATE 0.2 MG/ML IJ SOLN
INTRAMUSCULAR | Status: DC | PRN
  Administered 2020-10-22: .2 mg via INTRAVENOUS

## 2020-10-22 MED ORDER — MENTHOL 3 MG MT LOZG
LOZENGE | OROMUCOSAL | Status: AC
  Filled 2020-10-22: qty 9

## 2020-10-22 MED ORDER — LIDOCAINE 2% (20 MG/ML) 5 ML SYRINGE
INTRAMUSCULAR | Status: AC
  Filled 2020-10-22: qty 5

## 2020-10-22 MED ORDER — DEXAMETHASONE SODIUM PHOSPHATE 4 MG/ML IJ SOLN
INTRAMUSCULAR | Status: DC | PRN
  Administered 2020-10-22: 5 mg via INTRAVENOUS

## 2020-10-22 MED ORDER — OXYCODONE HCL 5 MG PO TABS: 5.0000 mg | ORAL_TABLET | Freq: Once | ORAL | Status: DC | PRN

## 2020-10-22 MED ORDER — SODIUM CHLORIDE 0.9 % IV SOLN: INTRAVENOUS | Status: DC

## 2020-10-22 MED ORDER — KETOROLAC TROMETHAMINE 30 MG/ML IJ SOLN: 30.0000 mg | Freq: Once | INTRAMUSCULAR | Status: AC | PRN

## 2020-10-22 MED ORDER — DIPHENHYDRAMINE HCL 50 MG/ML IJ SOLN: 12.5000 mg | Freq: Four times a day (QID) | INTRAMUSCULAR | Status: DC | PRN

## 2020-10-22 MED ORDER — EPHEDRINE SULFATE 50 MG/ML IJ SOLN
INTRAMUSCULAR | Status: DC | PRN
  Administered 2020-10-22: 15 mg via INTRAVENOUS
  Administered 2020-10-22: 10 mg via INTRAVENOUS

## 2020-10-22 MED ORDER — DIPHENHYDRAMINE HCL 12.5 MG/5ML PO ELIX: 12.5000 mg | ORAL_SOLUTION | Freq: Four times a day (QID) | ORAL | Status: DC | PRN

## 2020-10-22 MED ORDER — OXYCODONE HCL 5 MG/5ML PO SOLN
5.0000 mg | Freq: Once | ORAL | Status: DC | PRN
Start: 2020-10-22 — End: 2020-10-23

## 2020-10-22 MED ORDER — OXYBUTYNIN CHLORIDE 5 MG PO TABS
ORAL_TABLET | ORAL | Status: AC
  Filled 2020-10-22: qty 1

## 2020-10-22 MED ORDER — SODIUM CHLORIDE 0.9 % IR SOLN
3000.0000 mL | Status: DC
  Administered 2020-10-22 – 2020-10-23 (×5): 3000 mL

## 2020-10-22 MED ORDER — BELLADONNA ALKALOIDS-OPIUM 16.2-60 MG RE SUPP
RECTAL | Status: AC
  Filled 2020-10-22: qty 1

## 2020-10-22 MED ORDER — HYDROCODONE-ACETAMINOPHEN 5-325 MG PO TABS
1.0000 | ORAL_TABLET | ORAL | Status: DC | PRN
  Administered 2020-10-22 (×2): 1 via ORAL
  Administered 2020-10-22: 2 via ORAL
  Administered 2020-10-23: 1 via ORAL

## 2020-10-22 MED ORDER — LACTATED RINGERS IV SOLN
INTRAVENOUS | Status: DC
  Administered 2020-10-22: 1000 mL via INTRAVENOUS

## 2020-10-22 MED ORDER — MIDAZOLAM HCL 5 MG/5ML IJ SOLN
INTRAMUSCULAR | Status: DC | PRN
  Administered 2020-10-22: 1 mg via INTRAVENOUS

## 2020-10-22 MED ORDER — ONDANSETRON HCL 4 MG/2ML IJ SOLN: 4.0000 mg | Freq: Once | INTRAMUSCULAR | Status: DC | PRN

## 2020-10-22 SURGICAL SUPPLY — 25 items
BAG DRAIN URO-CYSTO SKYTR STRL (DRAIN) ×2 IMPLANT
BAG DRN RND TRDRP ANRFLXCHMBR (UROLOGICAL SUPPLIES) ×1
BAG DRN UROCATH (DRAIN) ×1
BAG URINE DRAIN 2000ML AR STRL (UROLOGICAL SUPPLIES) ×2 IMPLANT
BAND INSRT 18 STRL LF DISP RB (MISCELLANEOUS) ×1
BAND RUBBER #18 3X1/16 STRL (MISCELLANEOUS) ×1 IMPLANT
CATH FOLEY 3WAY 30CC 22FR (CATHETERS) IMPLANT
CATH FOLEY 3WAY 30CC 24FR (CATHETERS) ×2
CATH URTH STD 24FR FL 3W 2 (CATHETERS) IMPLANT
GLOVE SURG ENC MOIS LTX SZ7.5 (GLOVE) ×2 IMPLANT
GOWN STRL REUS W/TWL XL LVL3 (GOWN DISPOSABLE) ×2 IMPLANT
HOLDER FOLEY CATH W/STRAP (MISCELLANEOUS) ×2 IMPLANT
IV NS IRRIG 3000ML ARTHROMATIC (IV SOLUTION) ×18 IMPLANT
KIT TURNOVER CYSTO (KITS) ×2 IMPLANT
LOOP CUT BIPOLAR 24F LRG (ELECTROSURGICAL) ×3 IMPLANT
MANIFOLD NEPTUNE II (INSTRUMENTS) ×2 IMPLANT
NS IRRIG 500ML POUR BTL (IV SOLUTION) ×1 IMPLANT
PACK CYSTO (CUSTOM PROCEDURE TRAY) ×2 IMPLANT
PIN SAFETY STERILE (MISCELLANEOUS) ×2 IMPLANT
SYR 30ML LL (SYRINGE) ×2 IMPLANT
SYR TOOMEY IRRIG 70ML (MISCELLANEOUS) ×2
SYRINGE TOOMEY IRRIG 70ML (MISCELLANEOUS) ×1 IMPLANT
TUBE CONNECTING 12X1/4 (SUCTIONS) ×2 IMPLANT
TUBING UROLOGY SET (TUBING) ×2 IMPLANT
WATER STERILE IRR 500ML POUR (IV SOLUTION) ×1 IMPLANT

## 2020-10-22 NOTE — Anesthesia Procedure Notes (Signed)
Procedure Name: LMA Insertion Date/Time: 10/22/2020 10:58 AM Performed by: Cleda Clarks, CRNA Pre-anesthesia Checklist: Patient identified, Emergency Drugs available, Suction available and Patient being monitored Patient Re-evaluated:Patient Re-evaluated prior to induction Oxygen Delivery Method: Circle system utilized Preoxygenation: Pre-oxygenation with 100% oxygen Induction Type: IV induction Ventilation: Mask ventilation without difficulty LMA: LMA inserted LMA Size: 4.0 Number of attempts: 1 Airway Equipment and Method: Bite block Placement Confirmation: positive ETCO2 Tube secured with: Tape Dental Injury: Teeth and Oropharynx as per pre-operative assessment

## 2020-10-22 NOTE — Anesthesia Preprocedure Evaluation (Signed)
Anesthesia Evaluation  Patient identified by MRN, date of birth, ID band Patient awake    Reviewed: Allergy & Precautions, NPO status , Patient's Chart, lab work & pertinent test results  Airway Mallampati: II  TM Distance: >3 FB Neck ROM: Full    Dental no notable dental hx.    Pulmonary neg pulmonary ROS,    Pulmonary exam normal breath sounds clear to auscultation       Cardiovascular hypertension, Normal cardiovascular exam Rhythm:Regular Rate:Normal  No BP meds   Neuro/Psych negative neurological ROS  negative psych ROS   GI/Hepatic Neg liver ROS, GERD  Medicated,  Endo/Other  negative endocrine ROS  Renal/GU negative Renal ROS  negative genitourinary   Musculoskeletal negative musculoskeletal ROS (+)   Abdominal   Peds negative pediatric ROS (+)  Hematology negative hematology ROS (+)   Anesthesia Other Findings   Reproductive/Obstetrics negative OB ROS                             Anesthesia Physical Anesthesia Plan  ASA: 2  Anesthesia Plan: General   Post-op Pain Management:    Induction: Intravenous  PONV Risk Score and Plan: 2 and Ondansetron, Dexamethasone and Treatment may vary due to age or medical condition  Airway Management Planned: LMA  Additional Equipment:   Intra-op Plan:   Post-operative Plan: Extubation in OR  Informed Consent: I have reviewed the patients History and Physical, chart, labs and discussed the procedure including the risks, benefits and alternatives for the proposed anesthesia with the patient or authorized representative who has indicated his/her understanding and acceptance.     Dental advisory given  Plan Discussed with: CRNA and Surgeon  Anesthesia Plan Comments:         Anesthesia Quick Evaluation

## 2020-10-22 NOTE — Anesthesia Postprocedure Evaluation (Signed)
Anesthesia Post Note  Patient: Quintella Baton  Procedure(s) Performed: TRANSURETHRAL RESECTION OF THE PROSTATE (TURP) WITH CYSTOSCOPY (Prostate)     Patient location during evaluation: PACU Anesthesia Type: General Level of consciousness: awake and alert Pain management: pain level controlled Vital Signs Assessment: post-procedure vital signs reviewed and stable Respiratory status: spontaneous breathing, nonlabored ventilation, respiratory function stable and patient connected to nasal cannula oxygen Cardiovascular status: blood pressure returned to baseline and stable Postop Assessment: no apparent nausea or vomiting Anesthetic complications: no   No notable events documented.  Last Vitals:  Vitals:   10/22/20 1315 10/22/20 1330  BP: 126/79 121/81  Pulse: 65 65  Resp: 12 13  Temp:    SpO2: 97% 92%    Last Pain:  Vitals:   10/22/20 1330  TempSrc:   PainSc: 5                  Pheobe Sandiford S

## 2020-10-22 NOTE — H&P (Signed)
PRE-OP H&P  Office Visit Report     10/10/2020   Darren Thornton  MRN: [id number]  DOB: 1950/09/10, 70 year old Male  SSN:    PRIMARY CARE:  [name], MD  REFERRING:  [name]. [name], MD  PROVIDER:  Rhoderick Moody, M.D.  LOCATION:  [facility] [phone]   CC/HPI: Enlarged prostate   The patient is a 70 year old male with a history of BPH with urinary retention.   12/19/19: He was seen at the [facility] Emergency Department on 12/11/2019 due to acute urinary retention. He had an indwelling Foley catheter placed in the emergency department that drained greater than 900 mL of urine. He has a long history BPH and was previously followed by Dr. Leonette Most (PCP) at [facility]. He admits that he has been off of his BPH therapy the past 2-3 years. Leading up to the episode of urinary retention, the patient reports a weak force of stream and increased urgency/frequency. He denies any prior GU surgery/trauma and notes a remote history of UTIs, but nothing recent. He also reports that several years ago with another urology practice he had urodynamics performed that showed bladder outlet obstruction and that surgery was recommended at that time.   -No personal/family history of prostate cancer.   12/24/19: He is s/p successful TOV on 12/1. He returns today with complaints of increased voiding symptoms and intermittent fevers/chills. He states that he initially did well following TOV. However, approximately 3 days ago he developed progressively weaker urinary stream, increased frequency, and increased nocturia. He states he has frequency of about every hour. He had nocturia x3 last night. He has a sensation of incomplete emptying, but denies any suprapubic abdominal pain or unilateral flank pain. He states he has had some intermittent fevers with associated chills. He is not been able to measure his temperature. He has been using ibuprofen for management, which is  helpful. He last had an episode at approximately 2:00 a.m. this morning. He is currently afebrile in clinic with otherwise stable vital signs. He denies any perineal pain or rectal pain. No complaints of nausea or vomiting.   01/02/20: Patient with above history. He returns today for ongoing evaluation. Based on his presentation at prior office visit, he was started on empiric course of doxycycline. Ultimately, his urine culture was negative. However, he completed full course of antibiotic therapy as symptoms were improving. Currently, he feels he is voiding with a fair stream and emptying efficiently. Will have straining and intermittency of his stream on occasion. He denies bothersome frequency, urgency, or nocturia. He typically has nocturia times 1-2. No complaints of dysuria or hematuria. He denies any fever, chills, nausea, or vomiting. No complaints of unilateral flank pain or perineal pain.   04/01/20: The patient is here today for a routine follow-up. From a urinary standpoint, he continues to take tamsulosin once daily. He reports a weak FOS but feels like he is emptying his bladder adequately. He has occasional urgency/frequency, but is not bothered by it. Denies interval UTIs, dysuria or hematuria. Per the patient, his PSA came back "in the 4.0 range" at his PCP's office--no records available today.   07/10/2020: Prior PSA was 6.08. Repeated prior to today's visit it has decreased to 4.88 with 32% free. He was endorsing a weak stream and sensation of incomplete bladder emptying which correlated to his increased bladder residual at last visit. Tamsulosin was increased to b.i.d. dosing. Now back today follow-up exam, he  tells me he is taking tamsulosin 0.8 mg every evening which he finds it is easier to remain more compliant with the medication that way. Fortunately he has noted improvement with force of stream, less sensation of incomplete bladder emptying. Also noting an improvement from baseline  frequency/urgency and nocturia. Typically only getting up once per night, sometimes not at all. He denies any interval dysuria, gross hematuria, interval treatment for UTI.   09/10/2020: 70 year old male with the above noted past medical history. He is currently on tamsulosin 0.8 mg daily. His his last PSA was 4.88. At his last office visit, his lower urinary tract symptoms have greatly improved taking tamsulosin 0.8 mg daily. He reports that he typically gets up once per night and denied any interval difficulty starting his stream, feeling of incomplete bladder emptying, urinary frequency or urgency. He denies gross hematuria. While he was in [place], 2 weeks ago, he developed acute urinary retention. He reports this occurred out of the blue. He cannot attribute any interval voiding complaints or symptoms prior to this happening. He does not have his hospital notes with him at this time. He has had urodynamics a past by another urologist. He is very interested in pursuing surgical intervention and would like to discuss this further. He is also having difficulty with his erections and reports that while he has some firmness he often cannot penetrate. When he can penetrate, his erection does not last long. He denies cardiac issues, or use of nitrates. He has not tried anything for this in the past. He reports that this has gradually worsened.   09/16/2020: 70 year old male with the above noted past medical history he recently passed a trial of void on 09/10/2020. He continued on tamsulosin 0.8 mg daily. Over the last 3 days he reports progressive difficulty in emptying his bladder. One day ago his urine completely shut off and he presented to the emergency department. His bladder was decompressed a 1500 mL. Normally, he does not have any difficulty voiding although he has had retention 3 times in the past 2 years. He is very anxious to pursue any intervention that will prevent this from happening in the future.  He has a scheduled upcoming appointment with his urologist.   10/10/20: The patient is here today for a routine follow-up after UDS and TRUS, which revealed bladder outlet obstruction and a prostate volume of 99 cm^3, respectively. He continues to wear an indwelling Foley catheter and reports expected urethral irritation. He is very interested in pursuing bladder outlet surgery to address his ongoing urinary retention.     ALLERGIES: No Allergies    MEDICATIONS: Prilosec  Tamsulosin Hcl 0.4 mg capsule 1 capsule PO Q 12 H  Tamsulosin Hcl 0.4 mg capsule 1 capsule PO Daily  Sildenafil Citrate 20 mg tablet Start with 2 tablets one hour prior to sexual activity on a empty stomach. May increase dose by one tablet up to a total of 5 tablets, do not increase dose within 24 hours of taking last dosage.     GU PSH: Complex cystometrogram, w/ void pressure and urethral pressure profile studies, any technique - 09/30/2020, 09/30/2020 Complex Uroflow - 09/30/2020, 09/30/2020 Emg surf Electrd - 09/30/2020, 09/30/2020 Inject For cystogram - 09/30/2020, 09/30/2020 Intrabd voidng Press - 09/30/2020, 09/30/2020     NON-GU PSH: Hernia Repair - 1990 Treat Clavicle Fracture - 1973     GU PMH: Urinary Retention - 09/30/2020, - 09/26/2020, - 09/16/2020, - 09/10/2020, - 04/01/2020, - 01/02/2020, - 12/19/2019 BPH w/LUTS -  09/16/2020, - 09/10/2020, - 07/10/2020, - 04/01/2020, - 01/02/2020, - 12/24/2019, - 12/19/2019 Elevated PSA - 09/16/2020, - 07/10/2020 ED due to arterial insufficiency - 09/10/2020 Incomplete bladder emptying - 07/10/2020, - 04/01/2020 Weak Urinary Stream - 07/10/2020, - 04/01/2020, - 01/02/2020, - 12/24/2019    NON-GU PMH: GERD Hypertension    FAMILY HISTORY: No Family History    SOCIAL HISTORY: Marital Status: Married Preferred Language: English Current Smoking Status: Patient has never smoked.   Tobacco Use Assessment Completed: Used Tobacco in last 30 days? Drinks 1 drink per day.  Drinks 2 caffeinated  drinks per day. Patient's occupation is/was retired.    REVIEW OF SYSTEMS:    GU Review Male:   Patient denies frequent urination, hard to postpone urination, burning/ pain with urination, get up at night to urinate, leakage of urine, stream starts and stops, trouble starting your stream, have to strain to urinate , erection problems, and penile pain.  Gastrointestinal (Upper):   Patient denies nausea, vomiting, and indigestion/ heartburn.  Gastrointestinal (Lower):   Patient denies diarrhea and constipation.  Constitutional:   Patient denies fever, night sweats, weight loss, and fatigue.  Skin:   Patient denies skin rash/ lesion and itching.  Eyes:   Patient denies blurred vision and double vision.  Ears/ Nose/ Throat:   Patient denies sore throat and sinus problems.  Hematologic/Lymphatic:   Patient denies swollen glands and easy bruising.  Cardiovascular:   Patient denies leg swelling and chest pains.  Respiratory:   Patient denies cough and shortness of breath.  Endocrine:   Patient denies excessive thirst.  Musculoskeletal:   Patient denies back pain and joint pain.  Neurological:   Patient denies headaches and dizziness.  Psychologic:   Patient denies depression and anxiety.   VITAL SIGNS:      10/10/2020 09:53 AM  Weight 160 lb / 72.57 kg  Height 68 in / 172.72 cm  BP 161/75 mmHg  Pulse 57 /min  Temperature 97.8 F / 36.5 C  BMI 24.3 kg/m   Complexity of Data:  Lab Test Review:   PSA  Urodynamics Review:   Review Urodynamics Tests  X-Ray Review: Prostate Ultrasound: Reviewed Films. Reviewed Report. Discussed With Patient.     09/30/20 06/30/20 01/22/20  PSA  Total PSA 6.63 ng/mL 4.88 ng/mL 6.08 ng/ml  Free PSA  1.54 ng/mL   % Free PSA  32 % PSA     PROCEDURES: None   ASSESSMENT:      ICD-10 Details  1 GU:   BPH w/LUTS - N40.1 Chronic, Stable  2   Urinary Retention - R33.8 Chronic, Stable     PLAN:            Medications New Meds: Cephalexin 500 mg tablet 1  tablet PO BID Start taking 3 days prior to your scheduled surgery  #10  0 Refill(s)            Schedule Return Visit/Planned Activity: Next Available Appointment - Schedule Surgery          Document Letter(s):  Created for Patient: Clinical Summary         Notes:   -UDS and TRUS results discussed with the patient  -The risks, benefits and alternatives of cystoscopy with TURP was discussed with the patient. The risks included, but are not limited to, bleeding, urinary tract infection, bladder perforation requiring prolonged catheterization and/or open bladder repair, ureteral injury, ureteral obstruction, urethral stricture disease, new or worsening voiding dysfunction, retrograde ejaculation, MI, CVA, PE, DVT  and the inherent risks of general anesthesia. We also discussed the need for Foley catheterization for at least 3 days post-op and the likely need for post-op observation in the hospital following the procedure. The patient voices understanding and wishes to proceed.

## 2020-10-22 NOTE — Op Note (Addendum)
Operative Note  Preoperative diagnosis:  1.  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  Bipolar TURP  Surgeon: Rhoderick Moody, MD  Assistants:  None  Anesthesia:  General  Complications:  None  EBL:  250 mL  Specimens: 1. Prostate chips  Drains/Catheters: 1.  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 male with a history of BPH with urinary retention requiring an indwelling Foley catheter.  Despite medical therapy, the patient was unable to spontaneously void.  He had urodynamics that revealed evidence of bladder outlet obstruction but with adequate detrusor function.  Prostate ultrasound revealed a 99 cm prostate volume.  He is here today for a TURP in hopes of alleviating his bladder outlet obstruction.  He has been consented for the above procedures, voices understanding and wishes to proceed.  Description of procedure:  After informed consent was obtained, the patient was brought to the operating room and general anesthesia was administered. The patient was then placed in the dorsolithotomy position and prepped and draped in usual sterile fashion. A timeout was performed. A 23 French rigid cystoscope was then inserted into the urethral meatus and advanced into the bladder under direct vision. A complete bladder survey revealed no intravesical pathology.  Both ureteral orifices were identified and well away from the bladder neck.  The rigid cystoscope was then exchanged for a 26 French resectoscope with a bipolar loop working element.  Starting at the bladder neck and progressing distally to the verumontanum, the prostatic adenoma was systematically resected until a widely patent  prostatic urethral channel was created.  All prostate chips were then hand irrigated out of the bladder and sent to pathology for permanent section.  The resectoscope was then removed and exchanged for a 24 [place] three-way Foley catheter.  The three-way Foley catheter was then extensively hand irrigated until the irrigant returned clear to light pink.  The catheter was then placed to continuous bladder irrigation and placed on rubber band traction.  He tolerated the procedure well and was transferred to the postanesthesia unit in stable condition.  Plan:  CBI overnight

## 2020-10-22 NOTE — Transfer of Care (Signed)
Immediate Anesthesia Transfer of Care Note  Patient: Darren Thornton  Procedure(s) Performed: TRANSURETHRAL RESECTION OF THE PROSTATE (TURP) WITH CYSTOSCOPY (Prostate)  Patient Location: PACU  Anesthesia Type:General  Level of Consciousness: awake, alert  and oriented  Airway & Oxygen Therapy: Patient Spontanous Breathing and Patient connected to nasal cannula oxygen  Post-op Assessment: Report given to RN and Post -op Vital signs reviewed and stable  Post vital signs: Reviewed and stable  Last Vitals:  Vitals Value Taken Time  BP 119/87 10/22/20 1256  Temp    Pulse 73 10/22/20 1259  Resp 15 10/22/20 1259  SpO2 97 % 10/22/20 1259  Vitals shown include unvalidated device data.  Last Pain:  Vitals:   10/22/20 0948  TempSrc: Oral  PainSc: 0-No pain      Patients Stated Pain Goal: 5 (10/22/20 0948)  Complications: No notable events documented.

## 2020-10-23 ENCOUNTER — Encounter (HOSPITAL_BASED_OUTPATIENT_CLINIC_OR_DEPARTMENT_OTHER): Payer: Self-pay | Admitting: Urology

## 2020-10-23 DIAGNOSIS — N401 Enlarged prostate with lower urinary tract symptoms: Secondary | ICD-10-CM | POA: Diagnosis not present

## 2020-10-23 LAB — SURGICAL PATHOLOGY

## 2020-10-23 LAB — CBC
HCT: 37.6 % — ABNORMAL LOW (ref 39.0–52.0)
Hemoglobin: 12.3 g/dL — ABNORMAL LOW (ref 13.0–17.0)
MCH: 30.9 pg (ref 26.0–34.0)
MCHC: 32.7 g/dL (ref 30.0–36.0)
MCV: 94.5 fL (ref 80.0–100.0)
Platelets: 192 10*3/uL (ref 150–400)
RBC: 3.98 MIL/uL — ABNORMAL LOW (ref 4.22–5.81)
RDW: 13.2 % (ref 11.5–15.5)
WBC: 12.7 10*3/uL — ABNORMAL HIGH (ref 4.0–10.5)
nRBC: 0 % (ref 0.0–0.2)

## 2020-10-23 MED ORDER — HYDROCODONE-ACETAMINOPHEN 5-325 MG PO TABS
ORAL_TABLET | ORAL | Status: AC
  Filled 2020-10-23: qty 1

## 2020-10-23 MED ORDER — TRAMADOL HCL 50 MG PO TABS: 50.0000 mg | ORAL_TABLET | Freq: Four times a day (QID) | ORAL | 0 refills | Status: AC | PRN

## 2020-10-23 MED ORDER — OXYBUTYNIN CHLORIDE 5 MG PO TABS: 5.0000 mg | ORAL_TABLET | Freq: Three times a day (TID) | ORAL | 1 refills | Status: AC | PRN

## 2020-10-23 MED ORDER — PHENAZOPYRIDINE HCL 200 MG PO TABS: 200.0000 mg | ORAL_TABLET | Freq: Three times a day (TID) | ORAL | 0 refills | Status: AC | PRN

## 2020-10-23 MED ORDER — CEFAZOLIN SODIUM-DEXTROSE 1-4 GM/50ML-% IV SOLN
INTRAVENOUS | Status: AC
  Filled 2020-10-23: qty 50

## 2020-11-06 NOTE — Discharge Summary (Signed)
Date of admission: 10/22/2020  Date of discharge: 11/06/2020  Admission diagnosis: BPH with urinary retention  Discharge diagnosis: BPH with urinary retention  Procedures: TURP  History and Physical: For full details, please see admission history and physical. Briefly, Darren Thornton is a 70 y.o. year old patient with BPH with urinary retention.  He is s/p TURP on 10/22/20   Hospital Course: Routine post-op course following TURP  Physical Exam:  General: Alert and oriented CV: RRR, palpable distal pulses Lungs: CTAB, equal chest rise Abdomen: Soft, NTND, no rebound or guarding GU:  Foley draining clear urine with minimal CBI Ext: NT, No erythema  Laboratory values: No results for input(s): HGB, HCT in the last 72 hours. No results for input(s): CREATININE in the last 72 hours.  Disposition: Home  Discharge instruction: The patient was instructed to be ambulatory but told to refrain from heavy lifting, strenuous activity, or driving.  Discharge medications:  Allergies as of 10/23/2020   No Known Allergies      Medication List     TAKE these medications    multivitamin tablet Take 1 tablet by mouth daily.   omeprazole 20 MG capsule Commonly known as: PRILOSEC Take 20 mg by mouth daily.   oxybutynin 5 MG tablet Commonly known as: DITROPAN Take 1 tablet (5 mg total) by mouth every 8 (eight) hours as needed for bladder spasms.   phenazopyridine 200 MG tablet Commonly known as: Pyridium Take 1 tablet (200 mg total) by mouth 3 (three) times daily as needed (for pain with urination).   tamsulosin 0.4 MG Caps capsule Commonly known as: FLOMAX Take 0.4 mg by mouth daily.       ASK your doctor about these medications    traMADol 50 MG tablet Commonly known as: Ultram Take 1 tablet (50 mg total) by mouth every 6 (six) hours as needed for up to 3 days. Ask about: Should I take this medication?        Followup:   Follow-up Information     [facility] Follow up on 10/27/2020.   Contact information: [address] [place] [address] [phone]
